# Patient Record
Sex: Female | Born: 1975 | Race: Black or African American | Hispanic: No | Marital: Single | Smoking: Current every day smoker
Health system: Southern US, Community
[De-identification: ages and names within clinical notes are randomized; demographics above are authoritative.]

## PROBLEM LIST (undated history)

## (undated) DIAGNOSIS — F1910 Other psychoactive substance abuse, uncomplicated: Secondary | ICD-10-CM

## (undated) HISTORY — PX: NO PAST SURGERIES: SHX2092

---

## 1999-06-28 ENCOUNTER — Emergency Department (HOSPITAL_COMMUNITY): Admission: EM | Admit: 1999-06-28 | Discharge: 1999-06-28 | Payer: Self-pay | Admitting: Emergency Medicine

## 2002-10-11 ENCOUNTER — Emergency Department (HOSPITAL_COMMUNITY): Admission: EM | Admit: 2002-10-11 | Discharge: 2002-10-11 | Payer: Self-pay | Admitting: Emergency Medicine

## 2003-04-01 ENCOUNTER — Emergency Department (HOSPITAL_COMMUNITY): Admission: EM | Admit: 2003-04-01 | Discharge: 2003-04-02 | Payer: Self-pay | Admitting: Emergency Medicine

## 2003-04-01 ENCOUNTER — Encounter: Payer: Self-pay | Admitting: Emergency Medicine

## 2003-05-04 ENCOUNTER — Emergency Department (HOSPITAL_COMMUNITY): Admission: AD | Admit: 2003-05-04 | Discharge: 2003-05-04 | Payer: Self-pay | Admitting: Emergency Medicine

## 2003-05-04 ENCOUNTER — Encounter: Payer: Self-pay | Admitting: Emergency Medicine

## 2009-12-28 ENCOUNTER — Emergency Department (HOSPITAL_COMMUNITY): Admission: EM | Admit: 2009-12-28 | Discharge: 2009-12-28 | Payer: Self-pay | Admitting: Emergency Medicine

## 2011-04-03 ENCOUNTER — Emergency Department (HOSPITAL_COMMUNITY)
Admission: EM | Admit: 2011-04-03 | Discharge: 2011-04-03 | Disposition: A | Discharge status: Home / Self Care | Payer: Self-pay | Attending: Emergency Medicine | Admitting: Emergency Medicine

## 2011-04-03 DIAGNOSIS — M545 Low back pain, unspecified: Secondary | ICD-10-CM | POA: Insufficient documentation

## 2012-01-13 ENCOUNTER — Emergency Department (HOSPITAL_COMMUNITY)
Admission: EM | Admit: 2012-01-13 | Discharge: 2012-01-13 | Disposition: A | Discharge status: Home / Self Care | Payer: Self-pay | Attending: Emergency Medicine | Admitting: Emergency Medicine

## 2012-01-13 ENCOUNTER — Encounter (HOSPITAL_COMMUNITY): Payer: Self-pay | Admitting: *Deleted

## 2012-01-13 DIAGNOSIS — N390 Urinary tract infection, site not specified: Secondary | ICD-10-CM | POA: Insufficient documentation

## 2012-01-13 DIAGNOSIS — R21 Rash and other nonspecific skin eruption: Secondary | ICD-10-CM | POA: Insufficient documentation

## 2012-01-13 LAB — URINALYSIS, ROUTINE W REFLEX MICROSCOPIC
Ketones, ur: NEGATIVE mg/dL
Nitrite: NEGATIVE
Urobilinogen, UA: 0.2 mg/dL (ref 0.0–1.0)
pH: 6 (ref 5.0–8.0)

## 2012-01-13 MED ORDER — TRAMADOL HCL 50 MG PO TABS: 50.0000 mg | ORAL_TABLET | Freq: Once | ORAL | Status: AC

## 2012-01-13 MED ORDER — DIPHENHYDRAMINE HCL 25 MG PO CAPS
25.0000 mg | ORAL_CAPSULE | Freq: Once | ORAL | Status: AC
  Administered 2012-01-13: 25 mg via ORAL
  Filled 2012-01-13: qty 1

## 2012-01-13 MED ORDER — DIPHENHYDRAMINE HCL 25 MG PO CAPS: 25.0000 mg | ORAL_CAPSULE | Freq: Two times a day (BID) | ORAL | Status: AC

## 2012-01-13 MED ORDER — SULFAMETHOXAZOLE-TMP DS 800-160 MG PO TABS
1.0000 | ORAL_TABLET | Freq: Once | ORAL | Status: AC
  Administered 2012-01-13: 1 via ORAL
  Filled 2012-01-13: qty 1

## 2012-01-13 MED ORDER — SULFAMETHOXAZOLE-TRIMETHOPRIM 800-160 MG PO TABS: 1.0000 | ORAL_TABLET | Freq: Two times a day (BID) | ORAL | Status: AC

## 2012-01-13 MED ORDER — TRAMADOL HCL 50 MG PO TABS
50.0000 mg | ORAL_TABLET | Freq: Once | ORAL | Status: AC
  Administered 2012-01-13: 50 mg via ORAL
  Filled 2012-01-13: qty 1

## 2012-01-13 NOTE — ED Notes (Signed)
PT reports sever  Low back pain and a rash

## 2012-01-13 NOTE — ED Provider Notes (Signed)
History     CSN: [id number]  Arrival date & time 01/13/12  1309   First MD Initiated Contact with Patient 01/13/12 1423      Chief Complaint  Patient presents with  . Back Pain    rash ,itching     HPI The patient presents with back pain and a rash. The patient's back pain in approximately 4 days ago, gradually.  Since onset has been pain focally about the left lower back.  Pain is nonradiating, sore/sharp.  Patient is worse with motion.  Patient notes that she has not taken any medication in an effort to control her pain.  She denies any dysuria, fevers, chills, abdominal pain, vaginal bleeding or discharge.  She notes that approximately one year ago she had a similar episode of back pain, that was relieved with 1 week of analgesics.  2 days ago the patient a rash on her right upper shoulder and neck.  The rash consists of scattered minimally raised, indurated lesions.  It is pruritic.  Symptoms are controlled with calamine lotion somewhat. History reviewed. No pertinent past medical history.  History reviewed. No pertinent past surgical history.  No family history on file.  History  Substance Use Topics  . Smoking status: Current Everyday Smoker    Types: Cigarettes  . Smokeless tobacco: Never Used  . Alcohol Use: 0.6 oz/week    1 Cans of beer per week     1    OB History    Grav Para Term Preterm Abortions TAB SAB Ect Mult Living                  Review of Systems  All other systems reviewed and are negative.    Allergies  Review of patient's allergies indicates no known allergies.  Home Medications  No current outpatient prescriptions on file.  BP 103/67  Pulse 83  Temp(Src) 98.5 F (36.9 C) (Oral)  Resp 16  SpO2 99%  LMP 01/04/2012  Physical Exam  Nursing note and vitals reviewed. Constitutional: She is oriented to person, place, and time. She appears well-developed and well-nourished. No distress.  HENT:  Head: Normocephalic and atraumatic.  Eyes:  Conjunctivae and EOM are normal.  Cardiovascular: Normal rate and regular rhythm.   Pulmonary/Chest: Effort normal and breath sounds normal. No stridor. No respiratory distress.  Abdominal: She exhibits no distension.  Musculoskeletal: She exhibits no edema.       Arms: Neurological: She is alert and oriented to person, place, and time. No cranial nerve deficit.  Skin: Skin is warm and dry.     Psychiatric: She has a normal mood and affect.    ED Course  Procedures (including critical care time)   Labs Reviewed  URINALYSIS, ROUTINE W REFLEX MICROSCOPIC   No results found.   No diagnosis found.    MDM  This generally well female presents with new low back pain as well as a newer rash.  On exam she is in no distress.  Patient's vital signs are stable.  The rash is most consistent with insect bites, with low suspicion of either shingles or dermatitis.  Patient's back pain is most consistent with musculoskeletal etiology, though her urinalysis stress urinary tract infection.  Results were discussed the patient and her companion.  She was discharged in stable condition with instructions to follow up with primary care for continued evaluation and management of her condition  Gerhard Munch, MD 01/13/12 [phone]

## 2013-05-25 ENCOUNTER — Encounter (HOSPITAL_COMMUNITY): Payer: Self-pay

## 2013-05-25 ENCOUNTER — Emergency Department (HOSPITAL_COMMUNITY)
Admission: EM | Admit: 2013-05-25 | Discharge: 2013-05-25 | Disposition: A | Discharge status: Home / Self Care | Payer: No Typology Code available for payment source | Attending: Emergency Medicine | Admitting: Emergency Medicine

## 2013-05-25 DIAGNOSIS — F17200 Nicotine dependence, unspecified, uncomplicated: Secondary | ICD-10-CM | POA: Insufficient documentation

## 2013-05-25 DIAGNOSIS — Y9389 Activity, other specified: Secondary | ICD-10-CM | POA: Insufficient documentation

## 2013-05-25 DIAGNOSIS — Y998 Other external cause status: Secondary | ICD-10-CM | POA: Insufficient documentation

## 2013-05-25 DIAGNOSIS — S161XXS Strain of muscle, fascia and tendon at neck level, sequela: Secondary | ICD-10-CM

## 2013-05-25 DIAGNOSIS — S139XXA Sprain of joints and ligaments of unspecified parts of neck, initial encounter: Secondary | ICD-10-CM | POA: Insufficient documentation

## 2013-05-25 DIAGNOSIS — Y92410 Unspecified street and highway as the place of occurrence of the external cause: Secondary | ICD-10-CM | POA: Insufficient documentation

## 2013-05-25 MED ORDER — CYCLOBENZAPRINE HCL 10 MG PO TABS: 10.0000 mg | ORAL_TABLET | Freq: Two times a day (BID) | ORAL | Status: DC | PRN

## 2013-05-25 MED ORDER — OXYCODONE-ACETAMINOPHEN 5-325 MG PO TABS: 1.0000 | ORAL_TABLET | Freq: Four times a day (QID) | ORAL | Status: DC | PRN

## 2013-05-25 MED ORDER — IBUPROFEN 600 MG PO TABS: 600.0000 mg | ORAL_TABLET | Freq: Four times a day (QID) | ORAL | Status: DC | PRN
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
## 2013-05-26 NOTE — ED Provider Notes (Signed)
Evaluation and management procedures were performed by the PA/NP/CNM under my supervision/collaboration.   Jayvien Rowlette J Shantara Goosby, MD 05/26/13 0124 

## 2014-12-22 ENCOUNTER — Encounter (HOSPITAL_COMMUNITY): Payer: Self-pay

## 2014-12-22 ENCOUNTER — Emergency Department (HOSPITAL_COMMUNITY)
Admission: EM | Admit: 2014-12-22 | Discharge: 2014-12-22 | Disposition: A | Discharge status: Home / Self Care | Payer: Self-pay | Attending: Emergency Medicine | Admitting: Emergency Medicine

## 2014-12-22 ENCOUNTER — Emergency Department (HOSPITAL_COMMUNITY): Payer: Self-pay

## 2014-12-22 DIAGNOSIS — F1910 Other psychoactive substance abuse, uncomplicated: Secondary | ICD-10-CM

## 2014-12-22 DIAGNOSIS — Z3202 Encounter for pregnancy test, result negative: Secondary | ICD-10-CM | POA: Insufficient documentation

## 2014-12-22 DIAGNOSIS — R141 Gas pain: Secondary | ICD-10-CM | POA: Insufficient documentation

## 2014-12-22 DIAGNOSIS — IMO0001 Reserved for inherently not codable concepts without codable children: Secondary | ICD-10-CM

## 2014-12-22 DIAGNOSIS — F1410 Cocaine abuse, uncomplicated: Secondary | ICD-10-CM | POA: Insufficient documentation

## 2014-12-22 DIAGNOSIS — F1210 Cannabis abuse, uncomplicated: Secondary | ICD-10-CM | POA: Insufficient documentation

## 2014-12-22 DIAGNOSIS — Z720 Tobacco use: Secondary | ICD-10-CM | POA: Insufficient documentation

## 2014-12-22 DIAGNOSIS — N72 Inflammatory disease of cervix uteri: Secondary | ICD-10-CM | POA: Insufficient documentation

## 2014-12-22 LAB — URINALYSIS, ROUTINE W REFLEX MICROSCOPIC
Bilirubin Urine: NEGATIVE
Glucose, UA: NEGATIVE mg/dL
Hgb urine dipstick: NEGATIVE
KETONES UR: NEGATIVE mg/dL
LEUKOCYTES UA: NEGATIVE
NITRITE: NEGATIVE
PH: 5 (ref 5.0–8.0)
Protein, ur: NEGATIVE mg/dL
SPECIFIC GRAVITY, URINE: 1.017 (ref 1.005–1.030)
Urobilinogen, UA: 0.2 mg/dL (ref 0.0–1.0)

## 2014-12-22 LAB — WET PREP, GENITAL
Clue Cells Wet Prep HPF POC: NONE SEEN
YEAST WET PREP: NONE SEEN

## 2014-12-22 LAB — RAPID URINE DRUG SCREEN, HOSP PERFORMED
Amphetamines: NOT DETECTED
BENZODIAZEPINES: NOT DETECTED
Barbiturates: NOT DETECTED
Cocaine: POSITIVE — AB
Opiates: NOT DETECTED
Tetrahydrocannabinol: POSITIVE — AB

## 2014-12-22 LAB — POC URINE PREG, ED: PREG TEST UR: NEGATIVE

## 2014-12-22 MED ORDER — AZITHROMYCIN 1 G PO PACK
1.0000 g | PACK | Freq: Once | ORAL | Status: AC
  Administered 2014-12-22: 1 g via ORAL
  Filled 2014-12-22: qty 1

## 2014-12-22 MED ORDER — KETOROLAC TROMETHAMINE 30 MG/ML IJ SOLN
30.0000 mg | Freq: Once | INTRAMUSCULAR | Status: AC
  Administered 2014-12-22: 30 mg via INTRAVENOUS
  Filled 2014-12-22: qty 1

## 2014-12-22 MED ORDER — DICYCLOMINE HCL 10 MG/ML IM SOLN
20.0000 mg | Freq: Once | INTRAMUSCULAR | Status: AC
  Administered 2014-12-22: 20 mg via INTRAMUSCULAR
  Filled 2014-12-22: qty 2

## 2014-12-22 MED ORDER — SIMETHICONE 80 MG PO CHEW: 80.0000 mg | CHEWABLE_TABLET | Freq: Four times a day (QID) | ORAL | Status: DC | PRN

## 2014-12-22 MED ORDER — ONDANSETRON HCL 4 MG/2ML IJ SOLN
4.0000 mg | Freq: Once | INTRAMUSCULAR | Status: AC
  Administered 2014-12-22: 4 mg via INTRAVENOUS
  Filled 2014-12-22: qty 2

## 2014-12-22 MED ORDER — SODIUM CHLORIDE 0.9 % IV BOLUS (SEPSIS)
1000.0000 mL | Freq: Once | INTRAVENOUS | Status: AC
  Administered 2014-12-22: 1000 mL via INTRAVENOUS

## 2014-12-22 MED ORDER — CEFTRIAXONE SODIUM 250 MG IJ SOLR
250.0000 mg | Freq: Once | INTRAMUSCULAR | Status: AC
  Administered 2014-12-22: 250 mg via INTRAMUSCULAR
  Filled 2014-12-22: qty 250

## 2014-12-22 MED ORDER — LIDOCAINE HCL (PF) 1 % IJ SOLN
INTRAMUSCULAR | Status: AC
  Administered 2014-12-22: 0.9 mL
  Filled 2014-12-22: qty 5

## 2014-12-22 NOTE — ED Notes (Signed)
Dr. Nicanor AlconPalumbo and female tech at bedside with patient during pelvic exam.

## 2014-12-22 NOTE — ED Notes (Signed)
Dr. Palumbo at bedside. 

## 2014-12-22 NOTE — ED Notes (Signed)
Per radiology looking for abd with chest x-ray report.
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
## 2014-12-22 NOTE — Discharge Instructions (Signed)
Cervicitis °Cervicitis is a soreness and puffiness (inflammation) of the cervix.  °HOME CARE °· Do not have sex (intercourse) until your doctor says it is okay. °· Do not have sex until your partner is treated or as told by your doctor. °· Take your antibiotic medicine as told. Finish it even if you start to feel better. °GET HELP IF:  °· Your symptoms that brought you to the doctor come back. °· You have a fever. °MAKE SURE YOU:  °· Understand these instructions. °· Will watch your condition. °· Will get help right away if you are not doing well or get worse. °Document Released: 08/01/2008 Document Revised: 10/28/2013 Document Reviewed: 04/16/2013 °[employer]® Patient Information ©2015 [employer]. This information is not intended to replace advice given to you by your health care provider. Make sure you discuss any questions you have with your health care provider. ° °

## 2014-12-22 NOTE — ED Notes (Signed)
Pt admits to drinking "just a couple of tens" and about $20 worth of crack and marijuana.

## 2014-12-22 NOTE — ED Notes (Signed)
PER EMS: pt from home, reports LLQ and RLQ abdominal pain that radiates to her rectum that started when she was walking home from the store about an hour ago. Family reports the patient has vomited a couple of times but has not vomited with EMS. Pt anxious upon arrival to hospital. LMP unknown and patient admits to ETOH, crack and marijuana, last used tonight. BP- 138/88, HR-70, RR-18, 99% RA.

## 2014-12-23 LAB — GC/CHLAMYDIA PROBE AMP (~~LOC~~) NOT AT ARMC
Chlamydia: NEGATIVE
Neisseria Gonorrhea: NEGATIVE

## 2018-04-27 ENCOUNTER — Emergency Department (HOSPITAL_COMMUNITY)
Admission: EM | Admit: 2018-04-27 | Discharge: 2018-04-28 | Disposition: A | Discharge status: Home / Self Care | Payer: Self-pay | Attending: Emergency Medicine | Admitting: Emergency Medicine

## 2018-04-27 ENCOUNTER — Emergency Department (HOSPITAL_COMMUNITY): Payer: Self-pay

## 2018-04-27 ENCOUNTER — Encounter (HOSPITAL_COMMUNITY): Payer: Self-pay | Admitting: *Deleted

## 2018-04-27 ENCOUNTER — Other Ambulatory Visit: Payer: Self-pay

## 2018-04-27 DIAGNOSIS — W3400XA Accidental discharge from unspecified firearms or gun, initial encounter: Secondary | ICD-10-CM

## 2018-04-27 DIAGNOSIS — Z23 Encounter for immunization: Secondary | ICD-10-CM | POA: Insufficient documentation

## 2018-04-27 DIAGNOSIS — Y939 Activity, unspecified: Secondary | ICD-10-CM | POA: Insufficient documentation

## 2018-04-27 DIAGNOSIS — S41001A Unspecified open wound of right shoulder, initial encounter: Secondary | ICD-10-CM | POA: Insufficient documentation

## 2018-04-27 DIAGNOSIS — Y92810 Car as the place of occurrence of the external cause: Secondary | ICD-10-CM | POA: Insufficient documentation

## 2018-04-27 DIAGNOSIS — Y999 Unspecified external cause status: Secondary | ICD-10-CM | POA: Insufficient documentation

## 2018-04-27 LAB — BASIC METABOLIC PANEL
Anion gap: 13 (ref 5–15)
BUN: 11 mg/dL (ref 6–20)
CO2: 21 mmol/L — ABNORMAL LOW (ref 22–32)
Calcium: 8.7 mg/dL — ABNORMAL LOW (ref 8.9–10.3)
Chloride: 99 mmol/L — ABNORMAL LOW (ref 101–111)
Creatinine, Ser: 0.75 mg/dL (ref 0.44–1.00)
GFR calc Af Amer: >60 mL/min (ref >60)
GFR calc non Af Amer: >60 mL/min (ref >60)
Glucose, Bld: 70 mg/dL (ref 65–99)
Potassium: 3.7 mmol/L (ref 3.5–5.1)
Sodium: 133 mmol/L — ABNORMAL LOW (ref 135–145)

## 2018-04-27 LAB — CBC WITH DIFFERENTIAL/PLATELET
Abs Immature Granulocytes: 0 10*3/uL (ref 0.0–0.1)
Basophils Absolute: 0.1 10*3/uL (ref 0.0–0.1)
Basophils Relative: 1 %
Eosinophils Absolute: 0.1 10*3/uL (ref 0.0–0.7)
Eosinophils Relative: 2 %
HCT: 39.5 % (ref 36.0–46.0)
Hemoglobin: 13.4 g/dL (ref 12.0–15.0)
Immature Granulocytes: 0 %
Lymphocytes Relative: 46 %
Lymphs Abs: 2.2 10*3/uL (ref 0.7–4.0)
MCH: 33.3 pg (ref 26.0–34.0)
MCHC: 33.9 g/dL (ref 30.0–36.0)
MCV: 98.3 fL (ref 78.0–100.0)
Monocytes Absolute: 0.6 10*3/uL (ref 0.1–1.0)
Monocytes Relative: 11 %
Neutro Abs: 1.9 10*3/uL (ref 1.7–7.7)
Neutrophils Relative %: 40 %
Platelets: 262 10*3/uL (ref 150–400)
RBC: 4.02 MIL/uL (ref 3.87–5.11)
RDW: 12.6 % (ref 11.5–15.5)
WBC: 4.9 10*3/uL (ref 4.0–10.5)

## 2018-04-27 LAB — PROTIME-INR
INR: 1.07
Prothrombin Time: 13.8 seconds (ref 11.4–15.2)

## 2018-04-27 LAB — HCG, QUANTITATIVE, PREGNANCY: hCG, Beta Chain, Quant, S: <1 m[IU]/mL (ref <5)

## 2018-04-27 MED ORDER — FENTANYL CITRATE (PF) 100 MCG/2ML IJ SOLN
25.0000 ug | Freq: Once | INTRAMUSCULAR | Status: AC
  Administered 2018-04-27: 25 ug via INTRAVENOUS
  Filled 2018-04-27: qty 2

## 2018-04-27 MED ORDER — TETANUS-DIPHTH-ACELL PERTUSSIS 5-2.5-18.5 LF-MCG/0.5 IM SUSP
0.5000 mL | Freq: Once | INTRAMUSCULAR | Status: AC
  Administered 2018-04-27: 0.5 mL via INTRAMUSCULAR
  Filled 2018-04-27: qty 0.5

## 2018-04-27 MED ORDER — FENTANYL CITRATE (PF) 100 MCG/2ML IJ SOLN
25.0000 ug | Freq: Once | INTRAMUSCULAR | Status: DC
  Filled 2018-04-27: qty 2
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
## 2018-04-27 NOTE — ED Notes (Signed)
Patient transported to X-ray 
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
## 2018-04-27 NOTE — Progress Notes (Signed)
I met the patient very briefly between caregivers.  Chaplain asked if patient needed anyone to be contacted and she said no.  Chaplain offered to have prayer and she declined.  Chaplain noticed her tears and her fear.  The staff will take great care of you here. Peace. Conard Novak, Chaplain   04/27/18 2300  Clinical Encounter Type  Visited With Patient  Visit Type Initial  Referral From Other (Comment) (pager)  Consult/Referral To Chaplain  Spiritual Encounters  Spiritual Needs Emotional  Stress Factors  Patient Stress Factors Other (Comment) (very frightened)  Family Stress Factors None identified
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
## 2018-04-28 LAB — BPAM RBC
Blood Product Expiration Date: 201907052359
Blood Product Expiration Date: 201907162359
ISSUE DATE / TIME: 201906222358
ISSUE DATE / TIME: 201906222358
Unit Type and Rh: 9500
Unit Type and Rh: 9500

## 2018-04-28 LAB — TYPE AND SCREEN
Unit division: 0
Unit division: 0

## 2018-04-28 MED ORDER — FENTANYL CITRATE (PF) 100 MCG/2ML IJ SOLN
50.0000 ug | Freq: Once | INTRAMUSCULAR | Status: AC
  Administered 2018-04-28: 50 ug via INTRAVENOUS

## 2018-04-28 MED ORDER — MORPHINE SULFATE 30 MG PO TABS: 30.0000 mg | ORAL_TABLET | ORAL | 0 refills | Status: DC | PRN

## 2018-04-28 MED ORDER — FENTANYL CITRATE (PF) 100 MCG/2ML IJ SOLN
INTRAMUSCULAR | Status: AC
  Filled 2018-04-28: qty 2

## 2018-11-23 ENCOUNTER — Emergency Department (HOSPITAL_COMMUNITY)
Admission: EM | Admit: 2018-11-23 | Discharge: 2018-11-23 | Disposition: A | Discharge status: Home / Self Care | Payer: Self-pay | Attending: Emergency Medicine | Admitting: Emergency Medicine

## 2018-11-23 ENCOUNTER — Emergency Department (HOSPITAL_COMMUNITY): Payer: Self-pay

## 2018-11-23 ENCOUNTER — Other Ambulatory Visit: Payer: Self-pay

## 2018-11-23 DIAGNOSIS — R1110 Vomiting, unspecified: Secondary | ICD-10-CM | POA: Insufficient documentation

## 2018-11-23 DIAGNOSIS — R05 Cough: Secondary | ICD-10-CM | POA: Insufficient documentation

## 2018-11-23 DIAGNOSIS — R0789 Other chest pain: Secondary | ICD-10-CM

## 2018-11-23 DIAGNOSIS — F17210 Nicotine dependence, cigarettes, uncomplicated: Secondary | ICD-10-CM | POA: Insufficient documentation

## 2018-11-23 DIAGNOSIS — R0981 Nasal congestion: Secondary | ICD-10-CM | POA: Insufficient documentation

## 2018-11-23 MED ORDER — ALBUTEROL SULFATE HFA 108 (90 BASE) MCG/ACT IN AERS
2.0000 | INHALATION_SPRAY | Freq: Once | RESPIRATORY_TRACT | Status: AC
  Administered 2018-11-23: 2 via RESPIRATORY_TRACT
  Filled 2018-11-23: qty 6.7

## 2018-11-23 MED ORDER — ACETAMINOPHEN 500 MG PO TABS
1000.0000 mg | ORAL_TABLET | Freq: Once | ORAL | Status: AC
  Administered 2018-11-23: 1000 mg via ORAL
  Filled 2018-11-23: qty 2

## 2018-11-23 MED ORDER — KETOROLAC TROMETHAMINE 15 MG/ML IJ SOLN
30.0000 mg | Freq: Once | INTRAMUSCULAR | Status: AC
  Administered 2018-11-23: 30 mg via INTRAMUSCULAR
  Filled 2018-11-23: qty 2

## 2018-11-23 MED ORDER — IBUPROFEN 400 MG PO TABS: 400.0000 mg | ORAL_TABLET | Freq: Four times a day (QID) | ORAL | 0 refills | Status: DC | PRN

## 2018-11-23 NOTE — Discharge Instructions (Addendum)

## 2018-11-23 NOTE — ED Triage Notes (Signed)
Pt presents for evaluation of L sided pain (says chest, ribs, flank, back, and leg) onset this morning. Pt was moving cinderblocks yesterday without pain and when she woke up this morning was in pain. Denies N/V. Has had productive cough x-4 days. Pt's family gave her two "gout pills" prior to EMS arrival.

## 2018-11-23 NOTE — ED Notes (Signed)
Patient verbalizes understanding of discharge instructions. Opportunity for questioning and answers were provided. Armband removed by staff, pt discharged from ED in wheelchair.  

## 2018-11-23 NOTE — ED Provider Notes (Signed)
[facility] EMERGENCY DEPARTMENT Provider Note   CSN: [id number] Arrival date & time: 11/23/18  1343     History   Chief Complaint Chief Complaint  Patient presents with  . Flank Pain    HPI Michele Johnson is a 43 y.o. female.  HPI   Pt is 43 y/o female with no past medical history who presents to the emergency department today complaining of left lateral and anterior lower chest wall pain that began when she woke up this morning.  Pain is severe in nature and is constant.  It is worse when she moves her body or arms a certain way or when she takes a deep breath.  She states she did a lot of heavy lifting yesterday before her pain started.  States that pain is making her feel short of breath.  She denies abdominal pain.  She does note that she has had a cough, rhinorrhea, nasal congestion and sore throat for the last several days.  Cough is been productive of sputum.  No hemoptysis.  She reports sweats/chills as well.  Has had intermittent episodes of vomiting with this illness.  She has not vomited for 2 days.  She has also had some diarrhea about 1-2 episodes per day.  No abdominal pain.  No urinary symptoms.    No lower extremity swelling or pain.  No recent surgeries, hospital admissions, long trips.  No estrogen therapy.  No history of DVT/PE.  No past medical history on file.  There are no active problems to display for this patient.  No past surgical history on file.   OB History   No obstetric history on file.    Home Medications    Prior to Admission medications   Medication Sig Start Date End Date Taking? Authorizing Provider  Acetaminophen (TYLENOL PO) Take 2 tablets by mouth once.    [provider]  cyclobenzaprine (FLEXERIL) 10 MG tablet Take 1 tablet (10 mg total) by mouth 2 (two) times daily as needed for muscle spasms. Patient not taking: Reported on 12/22/2014 05/25/13   Marlon Pel, PA-C  ibuprofen (ADVIL,MOTRIN) 400 MG  tablet Take 1 tablet (400 mg total) by mouth every 6 (six) hours as needed. 11/23/18   Charlissa Petros S, PA-C  oxyCODONE-acetaminophen (PERCOCET/ROXICET) 5-325 MG per tablet Take 1 tablet by mouth every 6 (six) hours as needed for pain. Patient not taking: Reported on 12/22/2014 05/25/13   Marlon Pel, PA-C  ranitidine (ZANTAC) 150 MG tablet Take 150 mg by mouth 2 (two) times daily as needed for heartburn.    [provider]  simethicone (GAS-X) 80 MG chewable tablet Chew 1 tablet (80 mg total) by mouth every 6 (six) hours as needed for flatulence. 12/22/14   Palumbo, April, MD    Family History No family history on file.  Social History Social History   Tobacco Use  . Smoking status: Current Every Day Smoker    Types: Cigarettes  . Smokeless tobacco: Never Used  Substance Use Topics  . Alcohol use: Yes    Alcohol/week: 1.0 standard drinks    Types: 1 Cans of beer per week    Comment: 1  . Drug use: Yes    Types: Marijuana    Comment: crack     Allergies   Patient has no known allergies.   Review of Systems Review of Systems  Constitutional: Negative for fever.  HENT: Positive for congestion, rhinorrhea and sore throat.   Eyes: Negative for visual disturbance.  Respiratory: Positive for cough and shortness of breath.   Cardiovascular: Negative for leg swelling.       Chest wall pain  Gastrointestinal: Positive for diarrhea, nausea and vomiting (resolved). Negative for abdominal pain and constipation.  Genitourinary: Negative for dysuria, flank pain, frequency, hematuria and urgency.  Musculoskeletal:       Left arm pain  Neurological: Negative for headaches.   Physical Exam Updated Vital Signs BP (!) 148/78   Pulse 90   Temp 98.5 F (36.9 C) (Oral) Comment: Simultaneous filing. User may not have seen previous data. Comment (Src): Simultaneous filing. User may not have seen previous data.  Resp 16   Ht 5\' 9"  (1.753 m)   Wt 53.1 kg   LMP 10/19/2018  (Exact Date)   SpO2 96%   BMI 17.28 kg/m   Physical Exam Vitals signs and nursing note reviewed.  Constitutional:      General: She is not in acute distress.    Appearance: She is well-developed. She is not ill-appearing or toxic-appearing.  HENT:     Head: Normocephalic and atraumatic.     Nose: Nose normal.     Mouth/Throat:     Mouth: Mucous membranes are moist.     Pharynx: No oropharyngeal exudate or posterior oropharyngeal erythema.  Eyes:     Conjunctiva/sclera: Conjunctivae normal.  Neck:     Musculoskeletal: Neck supple.  Cardiovascular:     Rate and Rhythm: Normal rate and regular rhythm.     Heart sounds: Normal heart sounds. No murmur.  Pulmonary:     Effort: Pulmonary effort is normal. No respiratory distress.     Breath sounds: Normal breath sounds.     Comments: Patient has diffused tenderness to the left side of the chest wall that reproduces pain.  No step-off or obvious deformity.  No rashes or ecchymosis to the chest wall. Abdominal:     General: Bowel sounds are normal. There is no distension.     Palpations: Abdomen is soft.     Tenderness: There is no abdominal tenderness. There is no right CVA tenderness, left CVA tenderness, guarding or rebound.  Musculoskeletal:     Comments: Mild diffuse tenderness to the left upper extremity and in the left axillary area.  Skin:    General: Skin is warm and dry.  Neurological:     Mental Status: She is alert.    ED Treatments / Results  Labs (all labs ordered are listed, but only abnormal results are displayed) Labs Reviewed - No data to display  EKG None  Radiology Dg Ribs Unilateral W/chest Left  Result Date: 11/23/2018 CLINICAL DATA:  Left-sided pain.  Cough. EXAM: LEFT RIBS AND CHEST - 3+ VIEW COMPARISON:  December 22, 2014 FINDINGS: Frontal chest as well as oblique and cone-down rib images were obtained. There is mild atelectatic change in the left base. The lungs elsewhere are clear. Heart size and  pulmonary vascularity are normal. No adenopathy. There is no evident pneumothorax or pleural effusion. No evident fracture. IMPRESSION: No evident fracture. No pneumothorax. Mild left base atelectasis. Lungs elsewhere are clear. Electronically Signed   By: Bretta Bang III M.D.   On: 11/23/2018 15:09    Procedures Procedures (including critical care time)  Medications Ordered in ED Medications  ketorolac (TORADOL) 15 MG/ML injection 30 mg (30 mg Intramuscular Given 11/23/18 1438)  albuterol (PROVENTIL HFA;VENTOLIN HFA) 108 (90 Base) MCG/ACT inhaler 2 puff (2 puffs Inhalation Given 11/23/18 1439)  acetaminophen (TYLENOL) tablet 1,000 mg (1,000  mg Oral Given 11/23/18 1437)     Initial Impression / Assessment and Plan / ED Course  I have reviewed the triage vital signs and the nursing notes.  Pertinent labs & imaging results that were available during my care of the patient were reviewed by me and considered in my medical decision making (see chart for details).     Final Clinical Impressions(s) / ED Diagnoses   Final diagnoses:  Left-sided chest wall pain   Patient presenting with left-sided chest wall pain after lifting some heavy bricks yesterday.  Pain is worse with movement, deep breaths and with palpation.  She is satting well on room air with normal pulse.  Afebrile.  Lungs are clear bilaterally.  She has had recent URI symptoms for the last couple of days.  Her abdomen is soft and nontender.  She has no CVA tenderness bilaterally. Patient has diffused tenderness to the left side of the chest wall that reproduces pain.  No step-off or obvious deformity.  No rashes or ecchymosis to the chest wall.  Also has tenderness to the left upper extremity into the left axillary area.  I feel that her examination is consistent with musculoskeletal chest wall pain.  This is likely exacerbated by her lifting some heavy objects yesterday.  As above her lungs are clear.  Chest x-ray does not show any  evidence of pneumonia.  She is PERC negative and I doubt PE as a cause of her symptoms.  Doubt ACS or other acute cardiac abnormality as she is not complaining of any chest pain.  She will be discharged with Rx for anti-inflammatories and advised take over-the-counter medications for her URI symptoms.  Have advised to return to the ER for new or worsening symptoms the meantime.  She voiced understanding the plan agrees to return to ED.  All questions answered.  ED Discharge Orders         Ordered    ibuprofen (ADVIL,MOTRIN) 400 MG tablet  Every 6 hours PRN     11/23/18 1610           Taytum Scheck S, PA-C 11/23/18 1643    Derwood KaplanNanavati, Ankit, MD 11/24/18 1118
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
## 2020-09-23 ENCOUNTER — Ambulatory Visit: Payer: Self-pay | Admitting: *Deleted

## 2020-09-23 NOTE — Telephone Encounter (Signed)
Patient has abcess on left toe and patient wants to know if she can take tylenol.  Please advise  Patient 's mother reported symptoms of patient's left toe as swollen, full of pus, infected. Spoke with patient and swelling of toe started 1 month ago and now is red, swollen, painful and she is having difficulty walking. Swelling noted under left toenail and toe is numb. Patient advised she can take tylenol 325mg  , 2 tabs every 4-6 hours not to exceed 4000 mg in 24 hours. Advised she can apply warm compress to toe for pain. No PCP reported and encouraged patient to go to ED due to difficulty to walk and no PCP, if treatments are ineffective. Denies fever, drainage from toe. Care advise given. Patient verbalized understanding of care advise and to go to ED due to difficulty walking if other treatments are ineffective.  Reason for Disposition . [1] SEVERE pain (e.g., excruciating, unable to do any normal activities) AND [2] not improved after 2 hours of pain medicine  Answer Assessment - Initial Assessment Questions 1. ONSET: "When did the pain start?"      1 month ago  2. LOCATION: "Where is the pain located?"   (e.g., around nail, entire toe, at foot joint)      Left toe 3. PAIN: "How bad is the pain?"    (Scale 1-10; or mild, moderate, severe)   -  MILD (1-3): doesn't interfere with normal activities    -  MODERATE (4-7): interferes with normal activities (e.g., work or school) or awakens from sleep, limping    -  SEVERE (8-10): excruciating pain, unable to do any normal activities, unable to walk     Severe. difficulty walking  4. APPEARANCE: "What does the toe look like?" (e.g., redness, swelling, bruising, pallor)     Red swollen full of pus under nailbed, "looks infected" 5. CAUSE: "What do you think is causing the toe pain?"     Not sure  6. OTHER SYMPTOMS: "Do you have any other symptoms?" (e.g., leg pain, rash, fever, numbness)     Numbness in toe  7. PREGNANCY: "Is there any chance you  are pregnant?" "When was your last menstrual period?"     na  Protocols used: TOE PAIN-A-AH

## 2020-12-09 ENCOUNTER — Ambulatory Visit: Payer: Self-pay | Admitting: Internal Medicine

## 2021-02-28 ENCOUNTER — Ambulatory Visit: Payer: Self-pay | Admitting: Family Medicine

## 2024-03-18 LAB — AMB RESULTS CONSOLE CBG: Glucose: 148

## 2024-03-18 NOTE — Progress Notes (Signed)
 Pt received BP and CBG check and declined SDOH.

## 2024-06-10 NOTE — Progress Notes (Signed)
 The patient attended a screening event on 03/15/2024, where her blood pressure was measured at 136/72 mmHg after a second check, and her non-fasting blood glucose was 148 mg/dl putting her in prediabetic range. During the event, the patient reported that she does smoke, has no insurance, is not established with a PCP. The pt declined the SDOH screener.   A chart review confirmed that the patient does not have an active PC and no health insurance. .  CHW called the pt's number indicated in chart and screening form to discuss PCP status, health insurance, and smoking status. Pt's mother answered call. CHW asked to speak with the patient. Pt's mother shared that the patient was not in. CHW asked if call back number could be left for patient to return call. Pt's mother agreed to take down number for patient. CHW left call back number with mother for patient to return call to CHW. CHW called mobile number indicated in chart to speak with pt. CHW was unable to leave a vm at the mobile number. CHW will attempt another call later. CHW received a voicemail from pt calling back to discuss screening event. CHW returned call to pt's home number and was unable to leave a vm due to the mailbox being full. Pt shared a [employer] number to contact her at, she shared she can only use it when connected to wifi. CHW called the number and left a vm. CHW called pt's home number again and reached the pt's mother. The pt's mother shared she was not at home again. CHW asked mother to have the pt call between 9-10am to speak with CHW, mother understood and will inform patient. CHW received another voicemail and missed call from pt. CHW called home number back. Pt's mother shared she was not home again, CHW left message again with mother for pt to return call.   CHW sent with [facility] and [facility] PCP resource flyers, [place], [employer], and [facility] financial assistance information flyers, bp, blood sugar, smoking  cessation, and Healthy Cooking class. In case needed by patient.  An additional follow up will be done at a later date per the [facility] [employer] Teams protocol.

## 2024-07-30 NOTE — Progress Notes (Signed)
 The patient attended a screening event on 03/15/2024 where her screening results were a BP of 136/72 and a blood glucose of 148. At the event the patient noted she does smoke, has no insurance, has no PCP. Patient declined SDOH screening at the event.  Per chart review patient does not have a PCP, has no insurance, and has a smoking SDOH need. There are no [facility] visible encounters or future appts scheduled.  CHW attempted to reach out to the patient during 60 day f/u. Home number has been disconnected and is no longer in service. CHW attempted to reach patient via mobile number, mailbox was full and unable to leave a vm. Letter along with PCP, insurance, and smoking resources mailed. An additional follow up will be done in according to the health equity team's protocol.

## 2024-08-05 ENCOUNTER — Emergency Department (HOSPITAL_COMMUNITY): Payer: Self-pay

## 2024-08-05 ENCOUNTER — Encounter (HOSPITAL_COMMUNITY): Payer: Self-pay | Admitting: Emergency Medicine

## 2024-08-05 ENCOUNTER — Other Ambulatory Visit: Payer: Self-pay

## 2024-08-05 ENCOUNTER — Observation Stay (HOSPITAL_COMMUNITY)
Admission: EM | Admit: 2024-08-05 | Discharge: 2024-08-07 | Disposition: A | Discharge status: Home / Self Care | Payer: Self-pay | Attending: Emergency Medicine | Admitting: Emergency Medicine

## 2024-08-05 DIAGNOSIS — E871 Hypo-osmolality and hyponatremia: Secondary | ICD-10-CM | POA: Insufficient documentation

## 2024-08-05 DIAGNOSIS — E876 Hypokalemia: Secondary | ICD-10-CM | POA: Insufficient documentation

## 2024-08-05 DIAGNOSIS — F1910 Other psychoactive substance abuse, uncomplicated: Secondary | ICD-10-CM | POA: Insufficient documentation

## 2024-08-05 DIAGNOSIS — J189 Pneumonia, unspecified organism: Principal | ICD-10-CM | POA: Insufficient documentation

## 2024-08-05 HISTORY — DX: Other psychoactive substance abuse, uncomplicated: F19.10

## 2024-08-05 LAB — CBC
HCT: 39.1 % (ref 36.0–46.0)
Hemoglobin: 13.5 g/dL (ref 12.0–15.0)
MCH: 33.4 pg (ref 26.0–34.0)
MCHC: 34.5 g/dL (ref 30.0–36.0)
MCV: 96.8 fL (ref 80.0–100.0)
Platelets: 292 K/uL (ref 150–400)
RBC: 4.04 MIL/uL (ref 3.87–5.11)
RDW: 13 % (ref 11.5–15.5)
WBC: 20.1 K/uL — ABNORMAL HIGH (ref 4.0–10.5)
nRBC: 0 % (ref 0.0–0.2)

## 2024-08-05 LAB — COMPREHENSIVE METABOLIC PANEL WITH GFR
ALT: 12 U/L (ref 0–44)
AST: 18 U/L (ref 15–41)
Albumin: 3 g/dL — ABNORMAL LOW (ref 3.5–5.0)
Alkaline Phosphatase: 79 U/L (ref 38–126)
Anion gap: 15 (ref 5–15)
BUN: <5 mg/dL — ABNORMAL LOW (ref 6–20)
CO2: 24 mmol/L (ref 22–32)
Calcium: 8.8 mg/dL — ABNORMAL LOW (ref 8.9–10.3)
Chloride: 93 mmol/L — ABNORMAL LOW (ref 98–111)
Creatinine, Ser: 0.65 mg/dL (ref 0.44–1.00)
GFR, Estimated: >60 mL/min (ref >60)
Glucose, Bld: 134 mg/dL — ABNORMAL HIGH (ref 70–99)
Potassium: 3 mmol/L — ABNORMAL LOW (ref 3.5–5.1)
Sodium: 132 mmol/L — ABNORMAL LOW (ref 135–145)
Total Bilirubin: 0.7 mg/dL (ref 0.0–1.2)
Total Protein: 7.3 g/dL (ref 6.5–8.1)

## 2024-08-05 LAB — I-STAT CG4 LACTIC ACID, ED: Lactic Acid, Venous: 1.6 mmol/L (ref 0.5–1.9)

## 2024-08-05 LAB — HCG, SERUM, QUALITATIVE: Preg, Serum: NEGATIVE

## 2024-08-05 LAB — D-DIMER, QUANTITATIVE: D-Dimer, Quant: 2.48 ug{FEU}/mL — ABNORMAL HIGH (ref 0.00–0.50)

## 2024-08-05 LAB — TROPONIN I (HIGH SENSITIVITY): Troponin I (High Sensitivity): 10 ng/L (ref <18)

## 2024-08-05 MED ORDER — SODIUM CHLORIDE 0.9 % IV BOLUS
1000.0000 mL | Freq: Once | INTRAVENOUS | Status: AC
  Administered 2024-08-05: 1000 mL via INTRAVENOUS

## 2024-08-05 MED ORDER — ONDANSETRON HCL 4 MG/2ML IJ SOLN
4.0000 mg | Freq: Once | INTRAMUSCULAR | Status: AC
Start: 2024-08-05 — End: 2024-08-06
  Administered 2024-08-06: 4 mg via INTRAVENOUS
  Filled 2024-08-05: qty 2

## 2024-08-05 NOTE — ED Triage Notes (Signed)
 BIB ems from home. N/V/D x 3 days, chest pain and SOB x 3 days. Chills and warm to touch.    EMS vs  18g left arm  50 mcg fentanyl  500cc LR 132/82 94% on room air, placed on 4l [place] 96% 115 hr 40 RR

## 2024-08-05 NOTE — ED Triage Notes (Signed)
 Pt states she is homeless and needs a place to stay. She stays with mother sometimes.

## 2024-08-05 NOTE — ED Provider Notes (Incomplete)
 Michele Johnson Provider Note   CSN: [id number] Arrival date & time: 08/05/24  2231     Patient presents with: Emesis, Fever, and Shortness of Breath   Michele Johnson is a 48 y.o. female with no medical history.  The patient presents to the ED due to multiple symptoms.  She reports that for the last 3 days she has had chest pain, shortness of breath, abdominal pain, nausea, vomiting and diarrhea.  She also reports a headache since this time.  She states all of her symptoms again 3 days ago.  She reports that she is currently homeless but has been living with her mother for the last 3 days due to her symptoms.  She states that she often does crack cocaine as well as drinks alcohol.  She reports her last crack cocaine use was 3 days ago.  She reports that her chest pain and shortness of breath began 3 days ago and has been persistent since.  She is unsure if ambulation makes her symptoms worse because she has been laying around for the last 3 days.  She endorses multiple episodes of nausea vomiting and diarrhea.  She denies any dysuria but does endorse bilateral flank pain depending on which side she lays on.  She states that if she lays on her left side she will have left flank pain and if she lays on her right side she will have right flank pain.  She denies any dysuria or vaginal discharge or concern of STI.  She denies fevers or sick contacts.  She denies history of DVT or PE or anticoagulation.  She reports headache is also been present for the last 3 days and states that is the worst headache of her life but denies sudden onset, visual disturbance, neck pain, lightheadedness or dizziness.  She denies history of withdrawal.  She arrives slightly tachycardic and hypoxic.   Emesis Associated symptoms: fever   Fever Associated symptoms: vomiting   Shortness of Breath Associated symptoms: fever and vomiting        Prior to Admission medications    Medication Sig Start Date End Date Taking? Authorizing Provider  Acetaminophen  (TYLENOL  PO) Take 2 tablets by mouth once.    [provider]  cyclobenzaprine  (FLEXERIL ) 10 MG tablet Take 1 tablet (10 mg total) by mouth 2 (two) times daily as needed for muscle spasms. Patient not taking: Reported on 12/22/2014 05/25/13   Levora Riggs, PA-C  ibuprofen  (ADVIL ,MOTRIN ) 400 MG tablet Take 1 tablet (400 mg total) by mouth every 6 (six) hours as needed. 11/23/18   Couture, Cortni S, PA-C  morphine  (MSIR) 30 MG tablet Take 1 tablet (30 mg total) by mouth every 4 (four) hours as needed for up to 10 doses for severe pain. 04/28/18   Viktoria Song, MD  oxyCODONE -acetaminophen  (PERCOCET/ROXICET) 5-325 MG per tablet Take 1 tablet by mouth every 6 (six) hours as needed for pain. Patient not taking: Reported on 12/22/2014 05/25/13   Levora Riggs, PA-C  ranitidine (ZANTAC) 150 MG tablet Take 150 mg by mouth 2 (two) times daily as needed for heartburn.    [provider]  simethicone  (GAS-X) 80 MG chewable tablet Chew 1 tablet (80 mg total) by mouth every 6 (six) hours as needed for flatulence. 12/22/14   Palumbo, April, MD    Allergies: Patient has no known allergies.    Review of Systems  Constitutional:  Positive for fever.  Respiratory:  Positive for shortness of breath.  Gastrointestinal:  Positive for vomiting.    Updated Vital Signs BP (!) 148/82   Pulse (!) 112   Temp 99.1 F (37.3 C) (Oral)   Resp (!) 21   Ht 5' 9 (1.753 m)   Wt 53 kg   SpO2 100%   BMI 17.25 kg/m   Physical Exam  (all labs ordered are listed, but only abnormal results are displayed) Labs Reviewed  CULTURE, BLOOD (ROUTINE X 2)  CULTURE, BLOOD (ROUTINE X 2)  RESP PANEL BY RT-PCR (RSV, FLU A&B, COVID)  [id number]  SARS CORONAVIRUS 2 BY RT PCR  LIPASE, BLOOD  COMPREHENSIVE METABOLIC PANEL WITH GFR  CBC  URINALYSIS, ROUTINE W REFLEX MICROSCOPIC  HCG, SERUM, QUALITATIVE  D-DIMER, QUANTITATIVE  I-STAT  CG4 LACTIC ACID, ED  TROPONIN I (HIGH SENSITIVITY)    EKG: None  Radiology: No results found.  {Document cardiac monitor, telemetry assessment procedure when appropriate:32947} Procedures   Medications Ordered in the ED  sodium chloride  0.9 % bolus 1,000 mL (has no administration in time range)  ondansetron  (ZOFRAN ) injection 4 mg (has no administration in time range)      {Click here for ABCD2, HEART and other calculators REFRESH Note before signing:1}                              Medical Decision Making Amount and/or Complexity of Data Reviewed Labs: ordered. Radiology: ordered. ECG/medicine tests: ordered.  Risk Prescription drug management.   ***  {Document critical care time when appropriate  Document review of labs and clinical decision tools ie CHADS2VASC2, etc  Document your independent review of radiology images and any outside records  Document your discussion with family members, caretakers and with consultants  Document social determinants of health affecting pt's care  Document your decision making why or why not admission, treatments were needed:32947:::1}   Final diagnoses:  None    ED Discharge Orders     None
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
## 2024-08-05 NOTE — ED Notes (Signed)
 Patient transported to CT

## 2024-08-06 ENCOUNTER — Other Ambulatory Visit: Payer: Self-pay

## 2024-08-06 ENCOUNTER — Emergency Department (HOSPITAL_COMMUNITY): Payer: Self-pay

## 2024-08-06 ENCOUNTER — Encounter (HOSPITAL_COMMUNITY): Payer: Self-pay

## 2024-08-06 DIAGNOSIS — E871 Hypo-osmolality and hyponatremia: Secondary | ICD-10-CM | POA: Insufficient documentation

## 2024-08-06 DIAGNOSIS — J189 Pneumonia, unspecified organism: Secondary | ICD-10-CM | POA: Diagnosis present

## 2024-08-06 DIAGNOSIS — F1910 Other psychoactive substance abuse, uncomplicated: Secondary | ICD-10-CM | POA: Insufficient documentation

## 2024-08-06 DIAGNOSIS — E876 Hypokalemia: Secondary | ICD-10-CM | POA: Insufficient documentation

## 2024-08-06 LAB — LIPASE, BLOOD: Lipase: 14 U/L (ref 11–51)

## 2024-08-06 LAB — BLOOD CULTURE ID PANEL (REFLEXED) - BCID2

## 2024-08-06 LAB — PROCALCITONIN: Procalcitonin: 0.12 ng/mL

## 2024-08-06 LAB — I-STAT CG4 LACTIC ACID, ED: Lactic Acid, Venous: 1 mmol/L (ref 0.5–1.9)

## 2024-08-06 LAB — URINALYSIS, ROUTINE W REFLEX MICROSCOPIC
Bilirubin Urine: NEGATIVE
Glucose, UA: NEGATIVE mg/dL
Ketones, ur: NEGATIVE mg/dL
Nitrite: POSITIVE — AB
Protein, ur: NEGATIVE mg/dL
Specific Gravity, Urine: 1.044 — ABNORMAL HIGH (ref 1.005–1.030)
pH: 6 (ref 5.0–8.0)

## 2024-08-06 LAB — ETHANOL: Alcohol, Ethyl (B): <15 mg/dL (ref <15)

## 2024-08-06 LAB — TROPONIN I (HIGH SENSITIVITY): Troponin I (High Sensitivity): 6 ng/L (ref <18)

## 2024-08-06 LAB — MAGNESIUM: Magnesium: 1.6 mg/dL — ABNORMAL LOW (ref 1.7–2.4)

## 2024-08-06 LAB — RESP PANEL BY RT-PCR (RSV, FLU A&B, COVID)  RVPGX2
Influenza A by PCR: NEGATIVE
Influenza B by PCR: NEGATIVE
Resp Syncytial Virus by PCR: NEGATIVE
SARS Coronavirus 2 by RT PCR: NEGATIVE

## 2024-08-06 MED ORDER — IOHEXOL 350 MG/ML SOLN
105.0000 mL | Freq: Once | INTRAVENOUS | Status: AC | PRN
  Administered 2024-08-06: 105 mL via INTRAVENOUS

## 2024-08-06 MED ORDER — FENTANYL CITRATE PF 50 MCG/ML IJ SOSY
50.0000 ug | PREFILLED_SYRINGE | Freq: Once | INTRAMUSCULAR | Status: AC
  Administered 2024-08-06: 50 ug via INTRAVENOUS
  Filled 2024-08-06: qty 1

## 2024-08-06 MED ORDER — POTASSIUM CHLORIDE 20 MEQ PO PACK
80.0000 meq | PACK | Freq: Once | ORAL | Status: AC
  Administered 2024-08-06: 80 meq via ORAL
  Filled 2024-08-06: qty 4

## 2024-08-06 MED ORDER — IBUPROFEN 400 MG PO TABS
400.0000 mg | ORAL_TABLET | Freq: Four times a day (QID) | ORAL | Status: DC | PRN
  Administered 2024-08-06: 400 mg via ORAL
  Filled 2024-08-06 (×3): qty 1

## 2024-08-06 MED ORDER — SODIUM CHLORIDE 0.9 % IV SOLN
500.0000 mg | Freq: Once | INTRAVENOUS | Status: AC
  Administered 2024-08-06: 500 mg via INTRAVENOUS
  Filled 2024-08-06: qty 5

## 2024-08-06 MED ORDER — SODIUM CHLORIDE 0.9 % IV BOLUS
1000.0000 mL | Freq: Once | INTRAVENOUS | Status: AC
  Administered 2024-08-06: 1000 mL via INTRAVENOUS

## 2024-08-06 MED ORDER — MAGNESIUM SULFATE 2 GM/50ML IV SOLN
2.0000 g | Freq: Once | INTRAVENOUS | Status: AC
Start: 2024-08-06 — End: 2024-08-06
  Administered 2024-08-06: 2 g via INTRAVENOUS
  Filled 2024-08-06: qty 50

## 2024-08-06 MED ORDER — ENSURE PLUS HIGH PROTEIN PO LIQD
237.0000 mL | Freq: Two times a day (BID) | ORAL | Status: DC
  Administered 2024-08-06 – 2024-08-07 (×3): 237 mL via ORAL

## 2024-08-06 MED ORDER — ENOXAPARIN SODIUM 40 MG/0.4ML IJ SOSY
40.0000 mg | PREFILLED_SYRINGE | INTRAMUSCULAR | Status: DC
  Administered 2024-08-06: 40 mg via SUBCUTANEOUS
  Filled 2024-08-06: qty 0.4

## 2024-08-06 MED ORDER — ACETAMINOPHEN 325 MG PO TABS
650.0000 mg | ORAL_TABLET | Freq: Four times a day (QID) | ORAL | Status: DC | PRN
  Administered 2024-08-06 (×2): 650 mg via ORAL
  Filled 2024-08-06 (×2): qty 2

## 2024-08-06 MED ORDER — INFLUENZA VIRUS VACC SPLIT PF (FLUZONE) 0.5 ML IM SUSY
0.5000 mL | PREFILLED_SYRINGE | INTRAMUSCULAR | Status: DC
  Filled 2024-08-06: qty 0.5

## 2024-08-06 MED ORDER — ONDANSETRON HCL 4 MG/2ML IJ SOLN: 4.0000 mg | Freq: Four times a day (QID) | INTRAMUSCULAR | Status: DC | PRN

## 2024-08-06 MED ORDER — LEVALBUTEROL HCL 0.63 MG/3ML IN NEBU: 0.6300 mg | INHALATION_SOLUTION | Freq: Four times a day (QID) | RESPIRATORY_TRACT | Status: DC | PRN

## 2024-08-06 MED ORDER — SODIUM CHLORIDE 0.9 % IV SOLN
2.0000 g | INTRAVENOUS | Status: DC
  Administered 2024-08-07: 2 g via INTRAVENOUS
  Filled 2024-08-06: qty 20

## 2024-08-06 MED ORDER — POTASSIUM CHLORIDE 20 MEQ PO PACK: 80.0000 meq | PACK | Freq: Every day | ORAL | Status: DC

## 2024-08-06 MED ORDER — ONDANSETRON HCL 4 MG PO TABS: 4.0000 mg | ORAL_TABLET | Freq: Four times a day (QID) | ORAL | Status: DC | PRN

## 2024-08-06 MED ORDER — POTASSIUM CHLORIDE 10 MEQ/100ML IV SOLN
10.0000 meq | INTRAVENOUS | Status: AC
  Administered 2024-08-06 (×2): 10 meq via INTRAVENOUS
  Filled 2024-08-06 (×2): qty 100

## 2024-08-06 MED ORDER — SODIUM CHLORIDE 0.9 % IV SOLN
1.0000 g | Freq: Once | INTRAVENOUS | Status: AC
  Administered 2024-08-06: 1 g via INTRAVENOUS
  Filled 2024-08-06: qty 10

## 2024-08-06 MED ORDER — AZITHROMYCIN 500 MG PO TABS
500.0000 mg | ORAL_TABLET | ORAL | Status: DC
  Administered 2024-08-07: 500 mg via ORAL
  Filled 2024-08-06: qty 1

## 2024-08-06 NOTE — ED Notes (Signed)
 Pt ambulated with unsteady gait and minimal assistance. Signs and complaints of pain and discomfort O2 sats began at 90 and climbed to 94. RN and PA made aware.

## 2024-08-06 NOTE — ED Notes (Addendum)
 Patient oxygen on room air was 88% while walking in hallway. Then the patient started complaining of a lot of chest pain the Nurse was informed.

## 2024-08-06 NOTE — ED Notes (Signed)
 Patient moved to ER Room 5. Assumed care of patient at this time.

## 2024-08-06 NOTE — Assessment & Plan Note (Signed)
 No evidence of withdrawal at this time. - SW consult

## 2024-08-06 NOTE — ED Notes (Signed)
 PT refused ambulation. Stating I can't.

## 2024-08-06 NOTE — Assessment & Plan Note (Addendum)
-   Continue Rocephin , azithromycin  - Pulmonary toilet - Pneumonia protocol - Repeat CT in 3 months

## 2024-08-06 NOTE — Hospital Course (Addendum)
 48 y.o. F with no significant medical history, active crack cocaine use, smoking, presented with few days pleuritic chest pain and SOB.  Was in [facility] until a few days ago, started to get subjective fever, cough, and pleuritic pain.  This progressed to shortness of breath so she came to the ER.  In the ER, CTA chest ruled out PE but showed multifocal pneumonia.  COVID, Flu and RSV PCR negative.  Started on antibiotics.  Went to ambulate but O2 saturation dropped to 88%.

## 2024-08-06 NOTE — H&P (Signed)
 History and Physical    Patient: Michele Johnson FMW:[id number] DOB: 03/22/1976 DOA: 08/05/2024 DOS: the patient was seen and examined on 08/06/2024 PCP: Patient, No Pcp Per  Patient coming from: Home  Chief Complaint:  Chief Complaint  Patient presents with   Emesis   Fever   Shortness of Breath       HPI:  48 y.o. F with no significant medical history, active crack cocaine use, smoking, presented with few days pleuritic chest pain and SOB.  Was in [facility] until a few days ago, started to get subjective fever, cough, and pleuritic pain.  This progressed to shortness of breath so she came to the ER.  In the ER, CTA chest ruled out PE but showed multifocal pneumonia.  COVID, Flu and RSV PCR negative.  Started on antibiotics.  Went to ambulate but O2 saturation dropped to 88%.       Review of Systems  Constitutional:  Positive for chills, fever and malaise/fatigue.  Respiratory:  Positive for cough and shortness of breath. Negative for hemoptysis, sputum production and wheezing.   Cardiovascular:  Positive for chest pain. Negative for leg swelling and PND.  Gastrointestinal:  Positive for diarrhea, nausea and vomiting.  Genitourinary:  Negative for dysuria and urgency.  All other systems reviewed and are negative.    Past Medical History:  Diagnosis Date   Substance abuse [facility])    Past Surgical History:  Procedure Laterality Date   NO PAST SURGERIES     Social History: Currently homeless.  Active tobacco smoker, also THC and crack cocaine.     No Known Allergies  History reviewed. No pertinent family history.  Prior to Admission medications   None    Physical Exam: Vitals:   08/06/24 0400 08/06/24 0430 08/06/24 0500 08/06/24 0627  BP: 121/82 108/75 108/74 117/82  Pulse: (!) 102 92 89 95  Resp: (!) 24 (!) 31 (!) 22 20  Temp:   98.4 F (36.9 C) 99.7 F (37.6 C)  TempSrc:   Axillary Oral  SpO2: 100% 99% 98% 98%  Weight:      Height:       General  appearance: underweight adult female, appears uncofmortable.  No acute distress.    HEENT:  Anicteric, conjunctivae and sclerae normal without injection or icterus, lids and lashes normal.  Visual tracking smooth.  OP moist without lesions, dentition in poor repair, lips normal, normal auditory acuity   Cor: Tachycardic, regular, without murmurs, rubs.  JVP normal, no LE edema Resp:  Normal respiratory rate and rhythm.  Poor inspiratory effort, limited by pain, overall very diminished, no wheezing noted.    Abd:  No TTP or rebound all quadrants.  No masses or organomegaly.   No ascites, distension.No scars.  No striae, dilated veins, rashes, or lesions.  MSK: Symmetrical without gross deformities of the hands, large joints, or legs. Skin:  cap refill normal, Skin intact without significant rashes or lesions. Neuro:  Speech is fluent.  Naming is grossly intact, patient's recall, both recent and remote, seem within normal limits.  Muscle tone normal, face symmetric  Psych:  Attention span and concentration are within normal limits.  Affect normal.  appropriate thought content and normal rate of speech, thought process linear           Data Reviewed: CBC shows leukocytosis, no anemia BMP shows normal renal function.  Hyponatremia and hypokalemia noted. ECG personally reviewed shows sinus tachycardia, normal ST segments.   CTA chest report reviewed shows  no PE, does show multifocal pneumonia CXR personally reviewed, patchy basilar opacities   Assessment and Plan: * Community acquired pneumonia - Continue Rocephin , azithromycin  - Pulmonary toilet - Pneumonia protocol - Repeat CT in 3 months    Polysubstance abuse (HCC) No evidence of withdrawal at this time. - SW consult  Hypokalemia - Supp K - Check mag  Hyponatremia - IV fluids         Advance Care Planning: FULL  Consults: None  Family Communication: None present  Severity of Illness: PSI 37 but desaturating  with exertion.  The appropriate patient status for this patient is OBSERVATION. Observation status is judged to be reasonable and necessary in order to provide the required intensity of service to ensure the patient's safety. The patient's presenting symptoms, physical exam findings, and initial radiographic and laboratory data in the context of their medical condition is felt to place them at decreased risk for further clinical deterioration. Furthermore, it is anticipated that the patient will be medically stable for discharge from the hospital within 2 midnights of admission.   Author: Lonni SHAUNNA Dalton, MD 08/06/2024 8:28 AM  For on call review [email or web address].

## 2024-08-06 NOTE — Assessment & Plan Note (Signed)
 -  Supp K

## 2024-08-06 NOTE — ED Notes (Signed)
 Pt refused rectal temp x2.

## 2024-08-06 NOTE — Assessment & Plan Note (Signed)
 IV fluids

## 2024-08-06 NOTE — Progress Notes (Signed)
 1324: Phlebotomy called. States will retime HIV save tube for morning labs

## 2024-08-06 NOTE — Progress Notes (Signed)
 PHARMACY - PHYSICIAN COMMUNICATION CRITICAL VALUE ALERT - BLOOD CULTURE IDENTIFICATION (BCID)  Michele Johnson is an 48 y.o. female who presented to [facility] on 08/05/2024 with a chief complaint of CAP  Assessment:  1 of 4 bottles (anaerobic) staphylococcus species, no resistances noted.  Name of physician (or Provider) Contacted: Franky  Current antibiotics: Ceftriaxone /Azithromycin   Changes to prescribed antibiotics recommended:  Patient is on recommended antibiotics - No changes needed  Results for orders placed or performed during the hospital encounter of 08/05/24  Blood Culture ID Panel (Reflexed) (Collected: 08/05/2024 11:02 PM)  Result Value Ref Range   Enterococcus faecalis NOT DETECTED NOT DETECTED   Enterococcus Faecium NOT DETECTED NOT DETECTED   Listeria monocytogenes NOT DETECTED NOT DETECTED   Staphylococcus species DETECTED (A) NOT DETECTED   Staphylococcus aureus (BCID) NOT DETECTED NOT DETECTED   Staphylococcus epidermidis NOT DETECTED NOT DETECTED   Staphylococcus lugdunensis NOT DETECTED NOT DETECTED   Streptococcus species NOT DETECTED NOT DETECTED   Streptococcus agalactiae NOT DETECTED NOT DETECTED   Streptococcus pneumoniae NOT DETECTED NOT DETECTED   Streptococcus pyogenes NOT DETECTED NOT DETECTED   A.calcoaceticus-baumannii NOT DETECTED NOT DETECTED   Bacteroides fragilis NOT DETECTED NOT DETECTED   Enterobacterales NOT DETECTED NOT DETECTED   Enterobacter cloacae complex NOT DETECTED NOT DETECTED   Escherichia coli NOT DETECTED NOT DETECTED   Klebsiella aerogenes NOT DETECTED NOT DETECTED   Klebsiella oxytoca NOT DETECTED NOT DETECTED   Klebsiella pneumoniae NOT DETECTED NOT DETECTED   Proteus species NOT DETECTED NOT DETECTED   Salmonella species NOT DETECTED NOT DETECTED   Serratia marcescens NOT DETECTED NOT DETECTED   Haemophilus influenzae NOT DETECTED NOT DETECTED   Neisseria meningitidis NOT DETECTED NOT DETECTED   Pseudomonas  aeruginosa NOT DETECTED NOT DETECTED   Stenotrophomonas maltophilia NOT DETECTED NOT DETECTED   Candida albicans NOT DETECTED NOT DETECTED   Candida auris NOT DETECTED NOT DETECTED   Candida glabrata NOT DETECTED NOT DETECTED   Candida krusei NOT DETECTED NOT DETECTED   Candida parapsilosis NOT DETECTED NOT DETECTED   Candida tropicalis NOT DETECTED NOT DETECTED   Cryptococcus neoformans/gattii NOT DETECTED NOT DETECTED    Larraine Brazier, PharmD Clinical Pharmacist 08/06/2024  10:07 PM **Pharmacist phone directory can now be found on [email or web address] ([email or web address]).  Listed under [facility].

## 2024-08-06 NOTE — ED Notes (Signed)
 Patient refused a rectal temp.

## 2024-08-06 NOTE — Plan of Care (Signed)
   Problem: Education: Goal: Knowledge of General Education information will improve Description Including pain rating scale, medication(s)/side effects and non-pharmacologic comfort measures Outcome: Progressing

## 2024-08-07 ENCOUNTER — Other Ambulatory Visit (HOSPITAL_COMMUNITY): Payer: Self-pay

## 2024-08-07 LAB — CBC
HCT: 31.8 % — ABNORMAL LOW (ref 36.0–46.0)
Hemoglobin: 10.9 g/dL — ABNORMAL LOW (ref 12.0–15.0)
MCH: 33.9 pg (ref 26.0–34.0)
MCHC: 34.3 g/dL (ref 30.0–36.0)
MCV: 98.8 fL (ref 80.0–100.0)
Platelets: 274 K/uL (ref 150–400)
RBC: 3.22 MIL/uL — ABNORMAL LOW (ref 3.87–5.11)
RDW: 13.1 % (ref 11.5–15.5)
WBC: 13.8 K/uL — ABNORMAL HIGH (ref 4.0–10.5)
nRBC: 0 % (ref 0.0–0.2)

## 2024-08-07 LAB — URINE CULTURE

## 2024-08-07 LAB — HIV ANTIBODY (ROUTINE TESTING W REFLEX): HIV Screen 4th Generation wRfx: NONREACTIVE

## 2024-08-07 LAB — BASIC METABOLIC PANEL WITH GFR
Anion gap: 12 (ref 5–15)
BUN: <5 mg/dL — ABNORMAL LOW (ref 6–20)
CO2: 23 mmol/L (ref 22–32)
Calcium: 8.5 mg/dL — ABNORMAL LOW (ref 8.9–10.3)
Chloride: 104 mmol/L (ref 98–111)
Creatinine, Ser: 0.48 mg/dL (ref 0.44–1.00)
GFR, Estimated: >60 mL/min (ref >60)
Glucose, Bld: 97 mg/dL (ref 70–99)
Potassium: 3.4 mmol/L — ABNORMAL LOW (ref 3.5–5.1)
Sodium: 139 mmol/L (ref 135–145)

## 2024-08-07 MED ORDER — ENOXAPARIN SODIUM 30 MG/0.3ML IJ SOSY
30.0000 mg | PREFILLED_SYRINGE | INTRAMUSCULAR | Status: DC
  Filled 2024-08-07: qty 0.3

## 2024-08-07 MED ORDER — SODIUM CHLORIDE 0.9 % IV SOLN: 1.0000 g | INTRAVENOUS | Status: DC

## 2024-08-07 MED ORDER — AZITHROMYCIN 500 MG PO TABS
500.0000 mg | ORAL_TABLET | Freq: Every evening | ORAL | 0 refills | Status: AC
  Filled 2024-08-07: qty 3, 3d supply, fill #0

## 2024-08-07 MED ORDER — POTASSIUM CHLORIDE 20 MEQ PO PACK
40.0000 meq | PACK | Freq: Once | ORAL | Status: AC
  Administered 2024-08-07: 40 meq via ORAL
  Filled 2024-08-07: qty 2

## 2024-08-07 MED ORDER — CEFDINIR 300 MG PO CAPS
300.0000 mg | ORAL_CAPSULE | Freq: Two times a day (BID) | ORAL | 0 refills | Status: AC
  Filled 2024-08-07: qty 10, 5d supply, fill #0

## 2024-08-07 MED ORDER — GUAIFENESIN-CODEINE 100-10 MG/5ML PO SOLN
5.0000 mL | Freq: Three times a day (TID) | ORAL | 0 refills | Status: AC | PRN
  Filled 2024-08-07: qty 60, 4d supply, fill #0
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
## 2024-08-07 NOTE — TOC Transition Note (Signed)
 Transition of Care [facility]) - Discharge Note   Patient Details  Name: Michele Johnson MRN: [id number] Date of Birth: 1975-12-05  [facility] CM/SW Contact:  Tom-Johnson, Yahir Tavano Daphne, RN Phone Number: 08/07/2024, 1:29 PM   Clinical Narrative:     Patient is scheduled for discharge today.  New patient establishment, hospital f/u and discharge instructions on AVS. Prescriptions sent to [facility] pharmacy and patient will receive meds prior discharge. Cab voucher given at the discharge lounge to transport at discharge.  No further ICM needs noted.      Final next level of care: Homeless Shelter Barriers to Discharge: Barriers Resolved   Patient Goals and CMS Choice Patient states their goals for this hospitalization and ongoing recovery are:: Homeless CMS Medicare.gov Compare Post Acute Care list provided to:: Patient Choice offered to / list presented to : Patient      Discharge Placement                Patient to be transferred to facility by: Cab voucher given at the discharge lounge.      Discharge Plan and Services Additional resources added to the After Visit Summary for                  DME Arranged: N/A DME Agency: NA       HH Arranged: NA HH Agency: NA        Social Drivers of Health (SDOH) Interventions SDOH Screenings   Food Insecurity: Food Insecurity Present (08/06/2024)  Housing: High Risk (08/06/2024)  Transportation Needs: Unmet Transportation Needs (08/06/2024)  Utilities: Patient Unable To Answer (08/06/2024)  Social Connections: Moderately Integrated (08/06/2024)  Tobacco Use: High Risk (08/06/2024)     Readmission Risk Interventions     No data to display

## 2024-08-07 NOTE — Discharge Instructions (Signed)
 Transportation Resources  -[facility] for an application. No fee for people over the age of 30. P: [phone]. Address: [address], [place], [place] [address]  -Senior Wheels Transportation-Age 48 and over. Limit of 1 ride per week for ambulatory participants. Limit 1 ride per month for non-ambulatory participants needing wheelchair transportation. Contact: [phone] ([place]), [phone] [facility]).  -Access [place]: Available for people with disabilities in [facility] Limits who are unable to use fixed-route bus service. Fee applies. Contact: [phone] (For application requests)/([phone] (Customer service)/([phone] (I-ride reservation line)/([phone] (Access [place] reservation line)   [employer] assistance programs Crisis assistance programs  -[facility]elessness Arts development officer. If you are experiencing homelessness in [place], [place] , your first point of contact should be Pensions consultant. You can reach Coordinated Entry by calling ([phone] or by emailing [email or web address]@[email or web address] [email or web address].  Community access points: [employer] [phone] [address], [place]) every Tuesday from 9am-10am. [facility] ([address]. [address] [place]) every Wednesday from 8am-9am.   -[place] Coordinated Re-entry Daniel [place]: Dial 211 and request. Offers referrals to homeless shelters in the area.    -The [employer] (705)803-2611) offers several services to local families, as funding allows. The Emergency Assistance Program (EAP), which they administer, provides household goods, free food, clothing, and financial aid to people in need in the [place]  area. The EAP program does have some qualification, and counselors will interview clients for financial assistance by written referral only. Referrals need to be made by the Department of  Social Services or by other EAP approved human services agencies or charities in the area.  -[facility] of [employer], which can be reached at ([phone], offers emergency assistance programs for those in need of help, such as food, rent assistance, a soup kitchen, shelter, and clothing. They are based in [facility] [place]  but provide a number of services to those that qualify for assistance.   [facility] Department of Social Services may be able to offer temporary financial assistance and cash grants for paying rent and utilities, Help may be provided for local county residents who may be experiencing personal crisis when other resources, including government programs, are not available. Call [phone]  -High [employer] is a [employer] agency, The organization can offer emergency assistance for paying rent, Care[employer] Rx, utilities, food, household products and furniture. They offer extensive emergency and transitional housing for families, children and single women, and also run a Boy's and [employer]. Thrift Shops, Secondary school teacher, and other aid offered too. [address] [place], [place]  [address], [phone]  -[place] Low Income Energy Assistance Program -- This is offered for [facility] families. The federal government created [employer] Program provides a one-time cash grant payment to help eligible low-income families pay their electric and heating bills. [address] [place], [place]  [address], [phone]  -[place] Emergency Assistance -- A program offers emergency utility and rent funds for greater [employer] area residents. The program can also provide counseling and referrals to charities and government programs. Also provides food and a free meal program that serves lunch Mondays - Saturdays and dinner seven days per week to individuals in the community. [address] [place], [place]  [address], [phone]  -[employer] - Offers affordable apartment and housing communities across      [place]  and [facility]). The low income and seniors can access public housing, rental assistance to qualified applicants, and apply for the section 8 rent subsidy program. Other programs include Chiropractor and Engineer, maintenance. [address], [place], [place]  [address], dial [phone].  -The [facility] provides transitional housing to veterans and the disabled. Clients will also access other services too, including assistance in applying for Disability, life skills classes, case management, and assistance in finding permanent housing. [address], [place], [place]  [address], call [phone]  -[place] Transitional Housing through [employer] is for people who were just evicted or that are formerly homeless. The non-profit will also help then gain self-sufficiency, find a home or apartment to live in, and also provides information on rent assistance when needed. Phone [phone]  -The [place] [facility] [facility] helps low income, elderly, or disabled residents in seven counties in the [place] ([place], [place], [place], [place], [place], [place], [place], and [place]) save energy and reduce their utility bills by improving energy efficiency. Phone [phone].  -[employer] is located in the [place] [facility] in the [employer], [address], [place], [place] [address]. Parking is in the rear of the building. Phone: [phone]   General Email: info@gsohc .org  GHC provides free housing counseling assistance in locating affordable rental housing or housing with support services for families and individuals in crisis and the chronically homeless. We  provide potential resources for other housing needs like utilities. Our trained counselors also work with clients on budgeting and financial literacy in effort to empower them to take control of their financial situations. [employer] collaborates with homeless service providers and other stakeholders as part of the [employer] COC (Continuum of Care). The (COC) is a regional/local planning body that coordinates housing and services funding for homeless families and individuals. The role of [facility] in the COC is through housing counseling to work with people we serve on diversion strategies for those that are at imminent risk of becoming homeless. We also work with the Coordinated Assessment/Entry Specialist who attempts to find temporary solutions and/or connects the people to Housing First, Rapid Re-housing or transitional housing programs. Our Homelessness Prevention Housing Counselors meet with clients on business days (Monday-Fridays, except scheduled holidays) from 8:30 am to 4:30 pm.  Legal assistance for evictions, foreclosure, and more -If you need free legal advice on civil issues, such as foreclosures, evictions, Electronics engineer, government programs, domestic issues and more, Landscape architect of [place]  [facility]) is a Associate Professor firm that provides free legal services and counsel to lower income people, seniors, disabled, and others, The goal is to ensure everyone has access to justice and fair representation. Call them at [phone].  [facility] for Housing and Community Studies can provide info about obtaining legal assistance with evictions. Phone [phone].  Data processing manager  The [employer], [employer]. offers job and Dispensing optician. Resources are focused on helping students obtain the skills and experiences that are necessary to compete in today's challenging and tight job market. The non-profit faith-based community action agency offers  internship trainings as well as classroom instruction. Classes are tailored to meet the needs of people in the [facility] region. [place], [place] [address], [phone]  Foreclosure prevention/Debt Services Family Services of the [employer] Credit Counseling Service inludes debt and foreclosure prevention programs for local families. This includes money management, financial advice, budget review and development  of a written action plan with a nationally certified non-profit consumer credit counselor to help solve specific individual financial problems. In addition, housing and mortgage counselors can also provide pre- and post-purchase homeownership counseling, default resolution counseling (to prevent foreclosure) and reverse mortgage counseling. A Debt Management Program allows people and families with a high level of credit card or medical debt to consolidate and repay consumer debt and loans to creditors and rebuild positive credit ratings and scores. Contact ([phone] [phone].  Community clinics in [place] -Health Department [facility]: [address] [place], [place], [address]. [phone].  -Health Department [place] [facility]: [address], [facility], [address]. [phone].  -[facility] offers medical care through a group of doctors, pharmacies and other healthcare related agencies that offer services for low income, uninsured adults in [place]. Also offers adult Dental care and assistance with applying for an [employer]. Call [phone].   [facility]. This center provides low-cost health care to those without health insurance. Services offered include an onsite pharmacy. Phone [phone]. [address], [place].  -Medication Assistance Program serves as a link between pharmaceutical companies and patients to provide low cost or free prescription medications. This service is  available for residents who meet certain income restrictions and have no insurance coverage. PLEASE CALL [phone] [place]) OR [phone] ([place])  -One Step Further: Materials engineer, The [employer] Support & Nutrition Program, [employer]. Call [phone]/ [phone].  Food pantry and assistance -Urban Ministry-Food Bank: [address], [place] [address]. Phone [phone]  -[facility] Food Pantry: [address] [place], [place] [address]. [phone].  -Missionary Ministry: has the purpose of visiting the sick and shut-ins and provide for needs in the surrounding communities. Call [phone]. Email: [email or web address] [email or web address] This program provides: Food box for seniors, Financial assistance, Food to meet basic nutritional needs.  -Meals on Wheels with Senior Resources: [facility] residents age 9 and over who are homebound and unable to obtain and prepare a nutritious meal for themselves are eligible for this service. There may be a waiting list in certain parts of [facility] if the route in that area is full. If you are in [facility] and [place] call [phone] to register. For all other areas call [phone] to register.  -Greater Dietitian: https://findfood.BargainContractor.[email or web address]  TRANSPORTATION: -[employer] Department of Health: Call [facility] and [employer] at ([phone] for details. AttractionGuides.es  -Access [place]: Access [place] is the [employer]'s shared-ride transportation service for eligible riders who have a disability that prevents them from riding the fixed route bus. Call [phone]. Access [place] riders must pay a fare of $1.50 per trip, or may purchase a 10-ride punch card for $14.00 ($1.40 per ride) or a 40-ride punch card for $48.00 ($1.20 per ride).  -The Shepherd's  WHEELS rideshare transportation service is provided for senior citizens (60+) who live independently within [place] city limits and are unable to drive or have limited access to transportation. Call [phone] to schedule an appointment.  -[place] Transportation: For Medicare or Medicaid recipients call [phone]?[facility] Ambulance, wheelchair fleeta, and ambulatory quotes available.   FLEEING VIOLENCE: -Family Services of the [place]- 24/7 Crisis line (548)245-0014) -[facility] Justice Centers: (336) 641-SAFE [phone])   2-1-1 is another useful way to locate resources in the community. Visit ShedSizes.ch to find service information online. If you need additional assistance, 2-1-1  Referral Specialists are available 24 hours a day, every day by dialing 2-1-1 or ([phone] from any phone. The call is free, confidential, and available in any language.  Affordable Housing Search http://www.nchousingsearch.[facility])   M-F 8a-3p [address]  [place], [place] [address] [phone] Services include: laundry, barbering, support groups, case management, phone & computer access, showers, AA/NA mtgs, mental health/substance abuse nurse, job skills class, disability information, VA assistance, spiritual classes, etc. Winter Shelter available when temperatures are less than 32 degrees.   HOMELESS SHELTERS [facility] at [facility]- Call [phone] ext. 347 or ext. 336. Located at [address] [place], [place] [address]  [facility] Mens Shelter- Call [phone]. Located at [address] [place] [address].  [facility]- [employer]. Call [phone]. Office located at [address] [place] [address].  [facility] Family [facility] through [place] [phone].  [facility] Family Shelter- Call [phone]. Located at [address] [place], [place], [place]  [address].  Room at the Inn-For Pregnant mothers. Call [phone]. Located at [address] [place], [address].  [place] [facility]-For men in [place]. Call ([phone]. [facility]-Shelter in [place]. Call [phone].  Home of [employer] for [employer] [phone]. Office located at [address], [place], [address].  [employer] be agreeable to help with chores. Call [phone] ext. 5000.  Men's: [address] [place],  [address]. Women's: [facility]  [address] [place], [place] [address]  Crisis Services [facility] Mobile Crisis Management- [phone]  [facility] [address] [place], [place] [address]. Phone: [phone]

## 2024-08-07 NOTE — Progress Notes (Signed)
 Nsg: Oxygen Saturation.   Patient sitting- RA Spa02=100%  Patient ambulated in hallways on about 50 ft. Spa02=100% during and after ambulation.  Patient has a productive cough. No shortness of breath noted.

## 2024-08-08 LAB — CULTURE, BLOOD (ROUTINE X 2): Special Requests: ADEQUATE

## 2024-08-08 NOTE — Progress Notes (Signed)
 CSW gave pt housing and transportation resources and place them in the AVS.

## 2024-08-10 LAB — CULTURE, BLOOD (ROUTINE X 2)
Culture: NO GROWTH
Special Requests: ADEQUATE

## 2024-08-12 ENCOUNTER — Encounter: Payer: Self-pay | Admitting: Sports Medicine

## 2024-08-13 NOTE — Progress Notes (Signed)
 This encounter was created in error - please disregard.

## 2024-11-19 NOTE — Progress Notes (Signed)
 Pt attended 03/15/2024 screening event with BP of 136/72 and blood sugar was 148. Pt noted at event that she does not have a PCP. At event pt did not indicate any SDOH needs. Pt also noted that she is a smoker and listed None as her insurance at the event.   Per previous CHW encounters, pt has been unable to be reached but was mailed 2 follow up letters with PCP and insurance resources.   Per 6 month f/u pt was reached out via both numbers on file and was unable to reach pt and no vm could be left. Pt was sent 3rd and final letter with Get Care Now flyer, Multiple [facility] flyer, food, housing, & transportation resources, [place]'s Free Cooking Class flyer, BP management flyer, BP log, smoking cessation flyer, and Diagonal 211 card.  Per chart review pt does not have a PCP, insurance, and is a smoker. Pt does indicate food, housing, & transportation SDOH needs at this time.  No additional pt f/u to be scheduled at this time per health equity protocol.
